# Patient Record
Sex: Female | Born: 1960 | Race: White | Hispanic: No
Health system: Southern US, Community
[De-identification: ages and names within clinical notes are randomized; demographics above are authoritative.]

---

## 2003-04-18 ENCOUNTER — Encounter: Admission: RE | Admit: 2003-04-18 | Discharge: 2003-04-18 | Payer: Self-pay | Admitting: Specialist

## 2017-11-04 ENCOUNTER — Other Ambulatory Visit: Payer: Self-pay | Admitting: Orthopedic Surgery

## 2017-11-05 ENCOUNTER — Other Ambulatory Visit: Payer: Self-pay | Admitting: Orthopedic Surgery

## 2017-11-05 DIAGNOSIS — M9142 Coxa magna, left hip: Secondary | ICD-10-CM

## 2017-11-05 DIAGNOSIS — M1612 Unilateral primary osteoarthritis, left hip: Secondary | ICD-10-CM

## 2017-11-13 ENCOUNTER — Other Ambulatory Visit: Payer: Self-pay

## 2017-11-13 ENCOUNTER — Ambulatory Visit
Admission: RE | Admit: 2017-11-13 | Discharge: 2017-11-13 | Disposition: A | Discharge status: Home / Self Care | Payer: 59 | Source: Ambulatory Visit | Attending: Orthopedic Surgery | Admitting: Orthopedic Surgery

## 2017-11-13 DIAGNOSIS — M9142 Coxa magna, left hip: Secondary | ICD-10-CM

## 2017-11-13 DIAGNOSIS — M1612 Unilateral primary osteoarthritis, left hip: Secondary | ICD-10-CM

## 2018-11-09 ENCOUNTER — Encounter: Payer: Self-pay | Admitting: Neurology

## 2018-11-09 ENCOUNTER — Other Ambulatory Visit: Payer: Self-pay

## 2018-11-09 ENCOUNTER — Ambulatory Visit (INDEPENDENT_AMBULATORY_CARE_PROVIDER_SITE_OTHER): Payer: Managed Care, Other (non HMO) | Admitting: Neurology

## 2018-11-09 ENCOUNTER — Encounter (INDEPENDENT_AMBULATORY_CARE_PROVIDER_SITE_OTHER): Payer: Managed Care, Other (non HMO) | Admitting: Neurology

## 2018-11-09 DIAGNOSIS — R200 Anesthesia of skin: Secondary | ICD-10-CM | POA: Diagnosis not present

## 2018-11-09 DIAGNOSIS — G5603 Carpal tunnel syndrome, bilateral upper limbs: Secondary | ICD-10-CM | POA: Diagnosis not present

## 2018-11-09 DIAGNOSIS — Z0289 Encounter for other administrative examinations: Secondary | ICD-10-CM

## 2018-11-09 NOTE — Progress Notes (Signed)
Full Name: Joann Wagner Gender: Female MRN #: [id number] Date of Birth: [birth year]    Visit Date: 11/09/2018 13:32 Age: [age] Years 0 Months Old Examining Physician: Lesia SagoKeith Willis, MD  Referring Physician: Jodi GeraldsJohn Graves, MD    History: Joann Bravoerri Clingerman is a 58 year old woman with numbness, pain and weakness in both hands, right worse than left.  Nerve conduction studies: Bilateral median motor responses were slowed across the wrist, right worse than left with normal forearm conduction velocities.  Amplitudes were normal though lower on the right than the left.  Bilateral median palmar mixed sensory responses were slowed across the wrist, right worse than left.. The right amplitude was markedly reduced.  The right orthodromic median sensory response was absent. . Bilateral ulnar motor responses were normal.  Bilateral ulnar sensory responses were normal.  Ulnar F-wave responses were normal.  Electromyography: Needle EMG of selected muscles of the right arm showed normal motor unit action potentials and recruitment.  There was no abnormal spontaneous activity.  Needle EMG of both abductor pollicis brevis muscles was normal.  Impression: This NCV/EMG study shows the following: 1.   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 2.   There is no evidence of a superimposed right radiculopathy.  Monda Chastain A. Epimenio FootSater, MD, PhD, FAAN Certified in Neurology, Clinical Neurophysiology, Sleep Medicine, Pain Medicine and Neuroimaging Director, [facility]  [facility] [address], [address] [place], [place] [address] [phone]       [facility]    Nerve / Sites Muscle Latency Ref. Amplitude Ref. Rel Amp Segments Distance Velocity Ref. Area    ms ms mV mV %  cm m/s m/s mVms  R Median - APB     Wrist APB 7.0 ?4.4 4.4 ?4.0 100 Wrist - APB 7   16.6     Upper arm APB 11.1  3.8  86.7 Upper arm - Wrist 20 49  ?49 17.6  L Median - APB     Wrist APB 5.3 ?4.4 5.3 ?4.0 100 Wrist - APB 7   16.6     Upper arm APB 9.3  3.7  69.9 Upper arm - Wrist 20 49 ?49 13.3  R Ulnar - ADM     Wrist ADM 2.1 ?3.3 12.9 ?6.0 100 Wrist - ADM 7   33.0     B.Elbow ADM 5.6  11.9  92.3 B.Elbow - Wrist 18 52 ?49 31.6     A.Elbow ADM 7.6  11.0  92.9 A.Elbow - B.Elbow 10 51 ?49 30.7         A.Elbow - Wrist      L Ulnar - ADM     Wrist ADM 2.4 ?3.3 11.2 ?6.0 100 Wrist - ADM 7   29.2     B.Elbow ADM 5.7  10.1  90.3 B.Elbow - Wrist 18 55 ?49 27.7     A.Elbow ADM 7.8  9.7  95.7 A.Elbow - B.Elbow 10 49 ?49 26.1         A.Elbow - Wrist                 SNC    Nerve / Sites Rec. Site Peak Lat Ref.  Amp Ref. Segments Distance Peak Diff Ref.    ms ms V V  cm ms ms  R Median, Ulnar - Transcarpal comparison     Median Palm Wrist 4.9 ?2.2 6 ?35 Median Palm - Wrist 8  Ulnar Palm Wrist 1.8 ?2.2 17 ?12 Ulnar Palm - Wrist 8          Median Palm - Ulnar Palm  3.1 ?0.4  L Median, Ulnar - Transcarpal comparison     Median Palm Wrist 3.3 ?2.2 37 ?35 Median Palm - Wrist 8       Ulnar Palm Wrist 1.7 ?2.2 14 ?12 Ulnar Palm - Wrist 8          Median Palm - Ulnar Palm  1.6 ?0.4  R Median - Orthodromic (Dig II, Mid palm)     Dig II Wrist NR ?3.4 NR ?10 Dig II - Wrist 13    L Median - Orthodromic (Dig II, Mid palm)     Dig II Wrist 4.4 ?3.4 3 ?10 Dig II - Wrist 13    R Ulnar - Orthodromic, (Dig V, Mid palm)     Dig V Wrist 2.5 ?3.1 7 ?5 Dig V - Wrist 11    L Ulnar - Orthodromic, (Dig V, Mid palm)     Dig V Wrist 2.4 ?3.1 7 ?5 Dig V - Wrist 22                   F  Wave    Nerve F Lat Ref.   ms ms  R Ulnar - ADM 25.7 ?32.0  L Ulnar - ADM 28.3 ?32.0         EMG full       EMG Summary Table    Spontaneous MUAP Recruitment  Muscle IA Fib PSW Fasc Other Amp Dur. Poly Pattern  R. Deltoid Normal None None None _______ Normal Normal Normal Normal  R. Triceps brachii Normal None None None _______ Normal Normal 1+ Normal  R. Biceps  brachii Normal None None None _______ Normal Normal Normal Normal  R. Extensor digitorum communis Normal None None None _______ Normal Normal Normal Normal  R. First dorsal interosseous Normal None None None _______ Normal Normal Normal Normal  R. Abductor pollicis brevis Normal None None None _______ Normal Normal Normal Normal  L. Abductor pollicis brevis Normal None None None _______ Normal Normal Normal Normal

## 2021-11-15 ENCOUNTER — Other Ambulatory Visit: Payer: Self-pay | Admitting: Surgery

## 2021-11-15 DIAGNOSIS — R928 Other abnormal and inconclusive findings on diagnostic imaging of breast: Secondary | ICD-10-CM

## 2021-11-25 ENCOUNTER — Ambulatory Visit
Admission: RE | Admit: 2021-11-25 | Discharge: 2021-11-25 | Disposition: A | Discharge status: Home / Self Care | Payer: No Typology Code available for payment source | Source: Ambulatory Visit | Attending: Surgery | Admitting: Surgery

## 2021-11-25 ENCOUNTER — Encounter: Payer: Self-pay | Admitting: Radiology

## 2021-11-25 DIAGNOSIS — R928 Other abnormal and inconclusive findings on diagnostic imaging of breast: Secondary | ICD-10-CM

## 2021-11-25 HISTORY — PX: BREAST BIOPSY: SHX20

## 2021-11-25 MED ORDER — GADOBUTROL 1 MMOL/ML IV SOLN: 10.0000 mL | Freq: Once | INTRAVENOUS | Status: DC | PRN

## 2023-07-25 IMAGING — MR MR BREAST BX W LOC DEV 1ST LESION IMAGE BX SPEC MR GUIDE*L*
8 of 12 series · 30 of 48 positions shown · IV contrast (10 ml gadavist)
Comparison: None Available.
COMPARISON: None Available.

Addendum:
CLINICAL DATA: Abnormal enhancement in the retroareolar region of
the left breast and in the upper-outer quadrant of the left breast.
MR guided core biopsies recommended.

EXAM:
MRI GUIDED CORE NEEDLE BIOPSY OF THE LEFT BREAST
TECHNIQUE: Multiplanar, multisequence MR imaging of the left breast was
performed both before and after administration of intravenous
contrast.
CONTRAST:  10 mL of Gadavist

[Series 2: fiducial unilateral · sagittal · 2.0mm · 1.33mm/px · 3 of 52 slices shown]
[im 1/52]
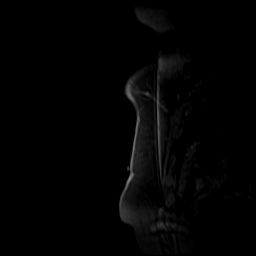
[im 26/52]
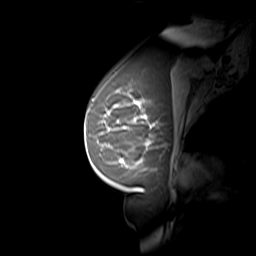
[im 52/52]
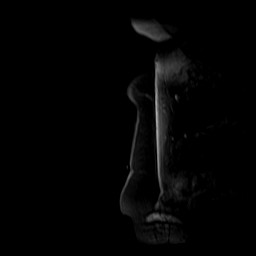

[Series 3: dynamic pre · axial · non-contrast · 1.3mm · 0.73mm/px · z∈[-80,+106]mm · 5 of 144 slices shown]
[im 1/144]
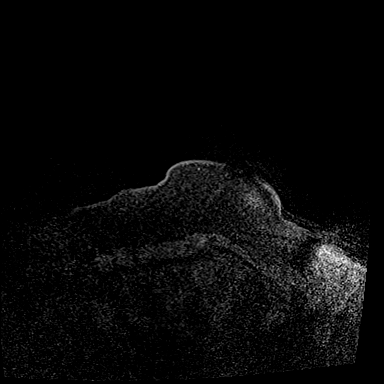
[im 36/144]
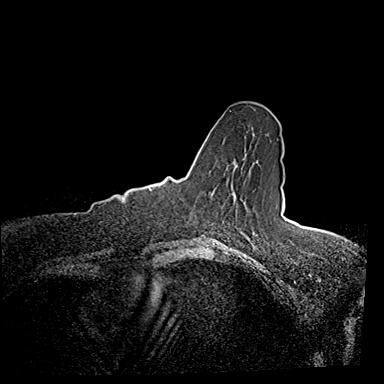
[im 72/144]
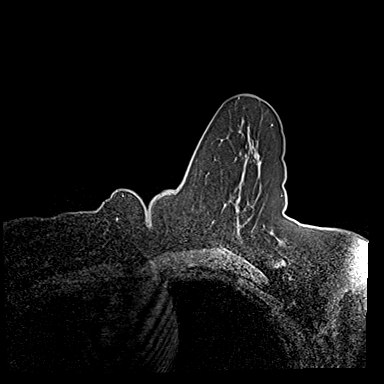
[im 108/144]
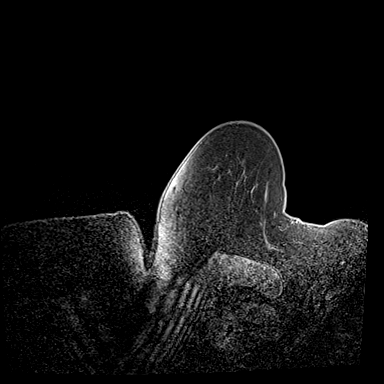
[im 144/144]
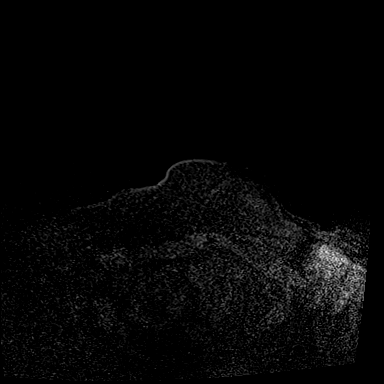

[Series 4: dynamic post 20 · axial · 1.3mm · 0.73mm/px · z∈[-80,+106]mm · 4 of 144 slices shown (1 of 2)]
[im 1/144]
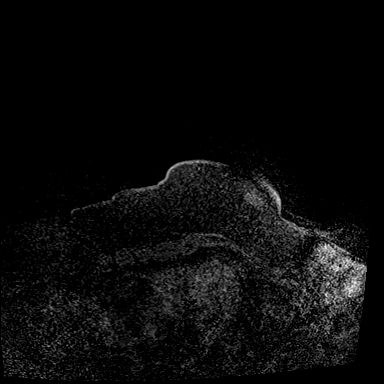
[im 48/144]
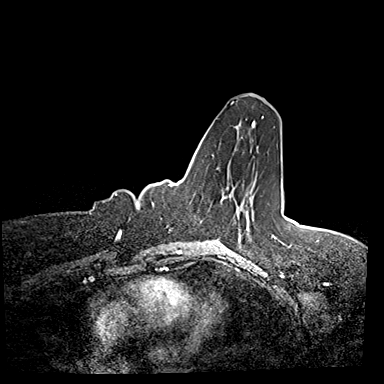
[im 96/144]
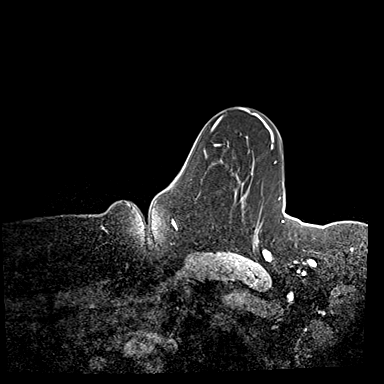
[im 144/144]
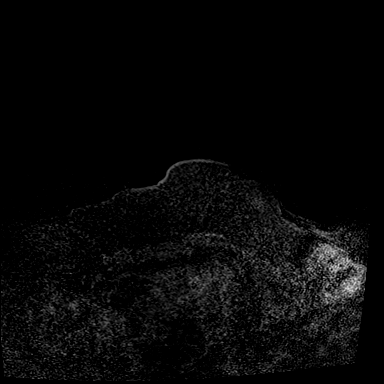

[Series 5: dynamic post 20 · axial · 1.3mm · 0.73mm/px · z∈[-80,+106]mm · 4 of 144 slices shown (2 of 2)]
[im 1/144]
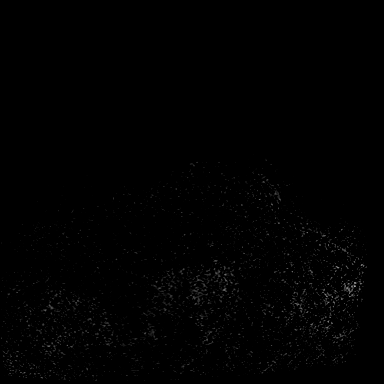
[im 48/144]
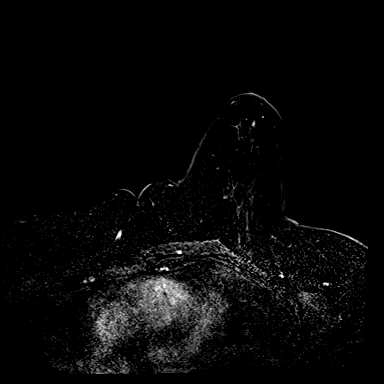
[im 96/144]
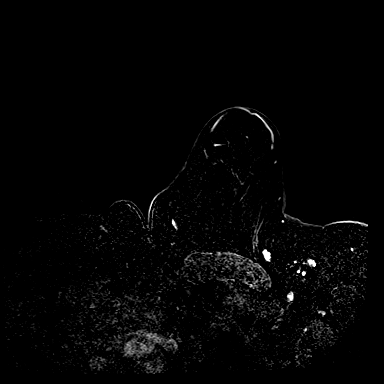
[im 144/144]
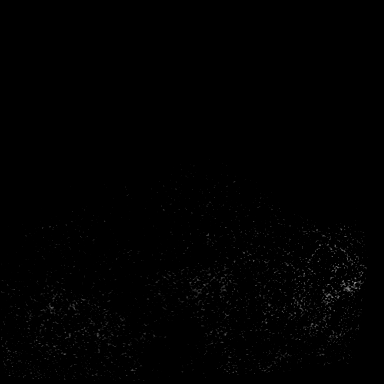

[Series 6: dynamic post 3 · axial · 1.3mm · 0.73mm/px · z∈[-80,+106]mm · 4 of 144 slices shown (1 of 2)]
[im 1/144]
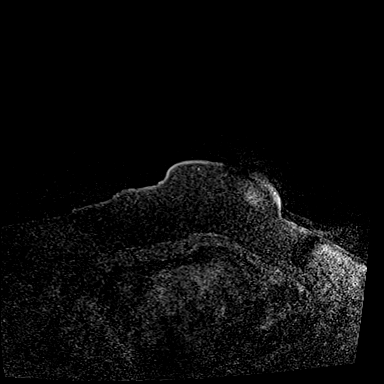
[im 48/144]
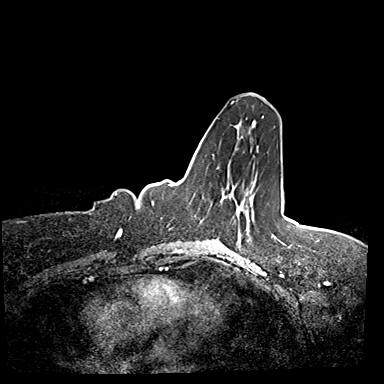
[im 96/144]
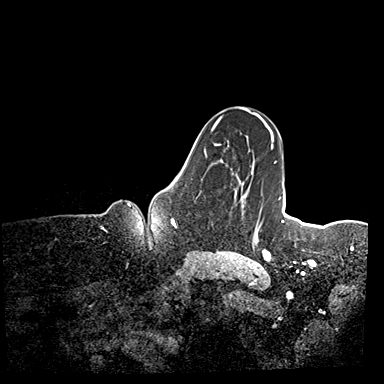
[im 144/144]
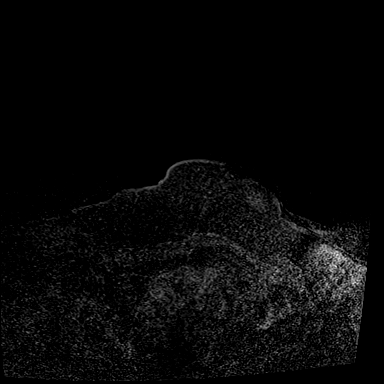

[Series 7: dynamic post 3 · axial · 1.3mm · 0.73mm/px · z∈[-80,+106]mm · 4 of 144 slices shown (2 of 2)]
[im 1/144]
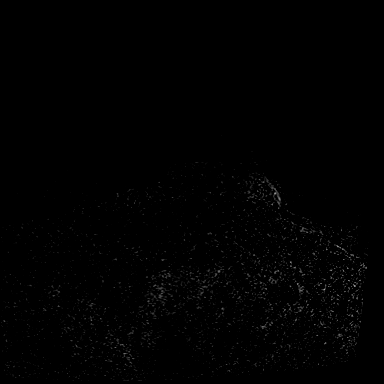
[im 48/144]
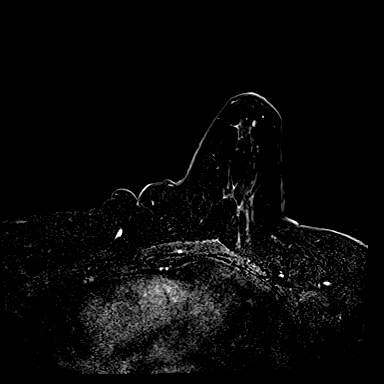
[im 96/144]
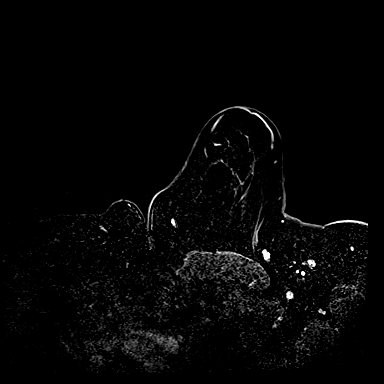
[im 144/144]
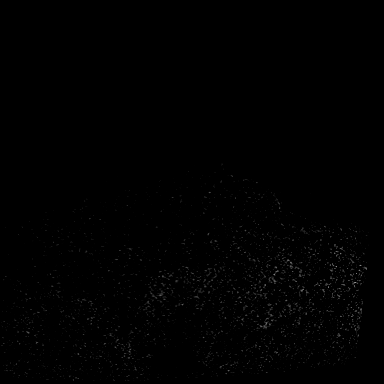

[Series 8: needle confirmation · axial · 1.3mm · 0.73mm/px · z∈[-80,+106]mm · 4 of 144 slices shown]
[im 1/144]
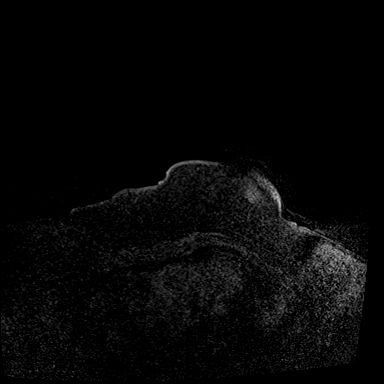
[im 48/144]
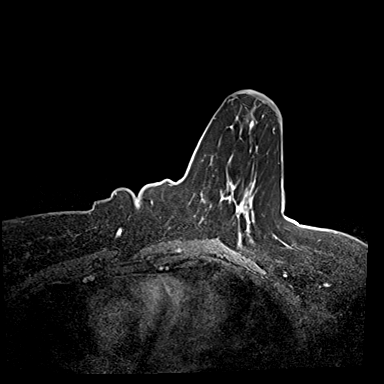
[im 96/144]
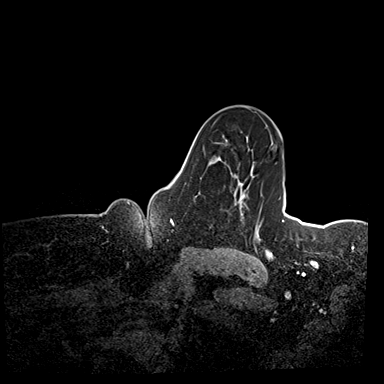
[im 144/144]
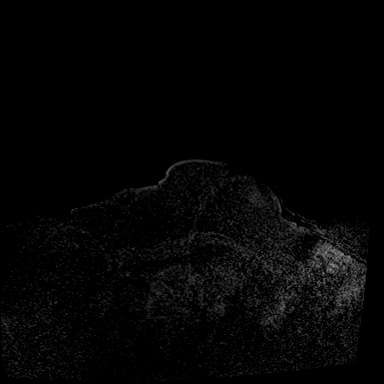

[Series 9: needle confirmation_sub · axial · 1.3mm · 0.73mm/px · z∈[-80,-19]mm · 2 of 144 slices shown]
[im 1/144]
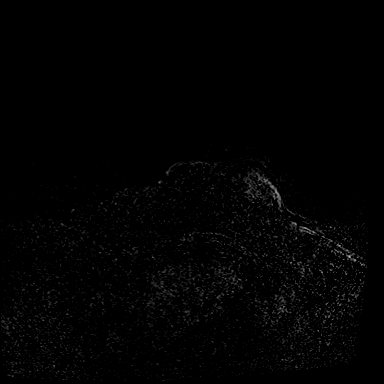
[im 48/144]
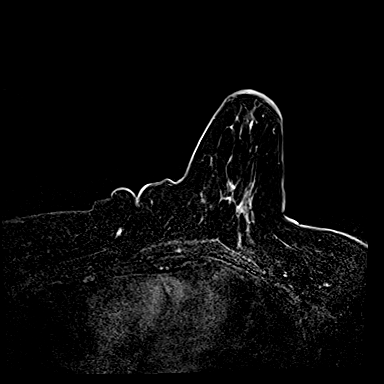

[30 of 48 positions shown; findings below may reference images not displayed]

FINDINGS: I met with the patient, and we discussed the procedure of MRI guided
biopsy, including risks, benefits, and alternatives. Specifically,
we discussed the risks of infection, bleeding, tissue injury, clip
migration, and inadequate sampling. Informed, written consent was
given. The usual time out protocol was performed immediately prior
to the procedure.

Using sterile technique, 1% Lidocaine, MRI guidance, and a 9 gauge
vacuum assisted device, biopsy was performed of abnormal enhancement
in the retroareolar region of the left breast using a lateral to
medial approach. At the conclusion of the procedure, a barbell
tissue marker clip was deployed into the biopsy cavity. Follow-up
2-view mammogram was performed and dictated separately.

I met with the patient, and we discussed the procedure of MRI guided
biopsy, including risks, benefits, and alternatives. Specifically,
we discussed the risks of infection, bleeding, tissue injury, clip
migration, and inadequate sampling. Informed, written consent was
given. The usual time out protocol was performed immediately prior
to the procedure.

Using sterile technique, 1% Lidocaine, MRI guidance, and a 9 gauge
vacuum assisted device, biopsy was performed of abnormal enhancement
in the upper-outer quadrant of the left breast using a lateral to
medial approach. At the conclusion of the procedure, a cylinder
tissue marker clip was deployed into the biopsy cavity. Follow-up
2-view mammogram was performed and dictated separately.
IMPRESSION: MRI guided biopsies of the left breast.  No apparent complications.

ADDENDUM:
Pathology revealed FRAGMENTS OF BENIGN INTRADUCTAL PAPILLOMA
PROLIFERATIVE FIBROCYSTIC CHANGES INCLUDING SCLEROSING ADENOSIS AND
USUAL DUCT HYPERPLASIA MICROCALCIFICATIONS PRESENT WITHIN ADENOSIS
NEGATIVE FOR CARCINOMA of the LEFT breast, retroareolar (barbell
clip). This was found to be concordant by Dr. Ademar Moten, with
surgical consultation for consideration of excision recommended.

Pathology revealed BENIGN BREAST WITH PROLIFERATIVE FIBROCYSTIC
CHANGES INCLUDING ADENOSIS AND USUAL DUCT HYPERPLASIA, COLUMNAR CELL
CHANGE, SMALL INTRADUCTAL PAPILLOMA, MICROCALCIFICATIONS PRESENT
WITHIN ADENOSIS AND COLUMNAR CELL CHANGE of the LEFT breast, upper
outer quadrant (cylinder clip. This was found to be concordant by
Dr. Ademar Moten, with surgical consultation for consideration of
excision recommended.

Pathology results were discussed with the patient by telephone. The
patient reported doing well after the biopsies with tenderness at
the sites. Post biopsy instructions and care were reviewed and
questions were answered. The patient was encouraged to call The

Per patient, she is scheduled with Dr. Md Mizanur Alipio on December 02, 2021.

Pathology results reported by Nizamy Fansuri RN on 11/26/2021.

*** End of Addendum ***
FINDINGS: I met with the patient, and we discussed the procedure of MRI guided
biopsy, including risks, benefits, and alternatives. Specifically,
we discussed the risks of infection, bleeding, tissue injury, clip
migration, and inadequate sampling. Informed, written consent was
given. The usual time out protocol was performed immediately prior
to the procedure.

Using sterile technique, 1% Lidocaine, MRI guidance, and a 9 gauge
vacuum assisted device, biopsy was performed of abnormal enhancement
in the retroareolar region of the left breast using a lateral to
medial approach. At the conclusion of the procedure, a barbell
tissue marker clip was deployed into the biopsy cavity. Follow-up
2-view mammogram was performed and dictated separately.

I met with the patient, and we discussed the procedure of MRI guided
biopsy, including risks, benefits, and alternatives. Specifically,
we discussed the risks of infection, bleeding, tissue injury, clip
migration, and inadequate sampling. Informed, written consent was
given. The usual time out protocol was performed immediately prior
to the procedure.

Using sterile technique, 1% Lidocaine, MRI guidance, and a 9 gauge
vacuum assisted device, biopsy was performed of abnormal enhancement
in the upper-outer quadrant of the left breast using a lateral to
medial approach. At the conclusion of the procedure, a cylinder
tissue marker clip was deployed into the biopsy cavity. Follow-up
2-view mammogram was performed and dictated separately.
IMPRESSION: MRI guided biopsies of the left breast.  No apparent complications.

## 2023-07-25 IMAGING — MG MM BREAST LOCALIZATION CLIP
4 series · 4 of 12 positions shown · non-contrast
Comparison: Previous exam(s).

CLINICAL DATA: Status post MR guided core biopsies the left breast

EXAM:
3D DIAGNOSTIC LEFT MAMMOGRAM POST ULTRASOUND BIOPSY

[L ML synth-2D]
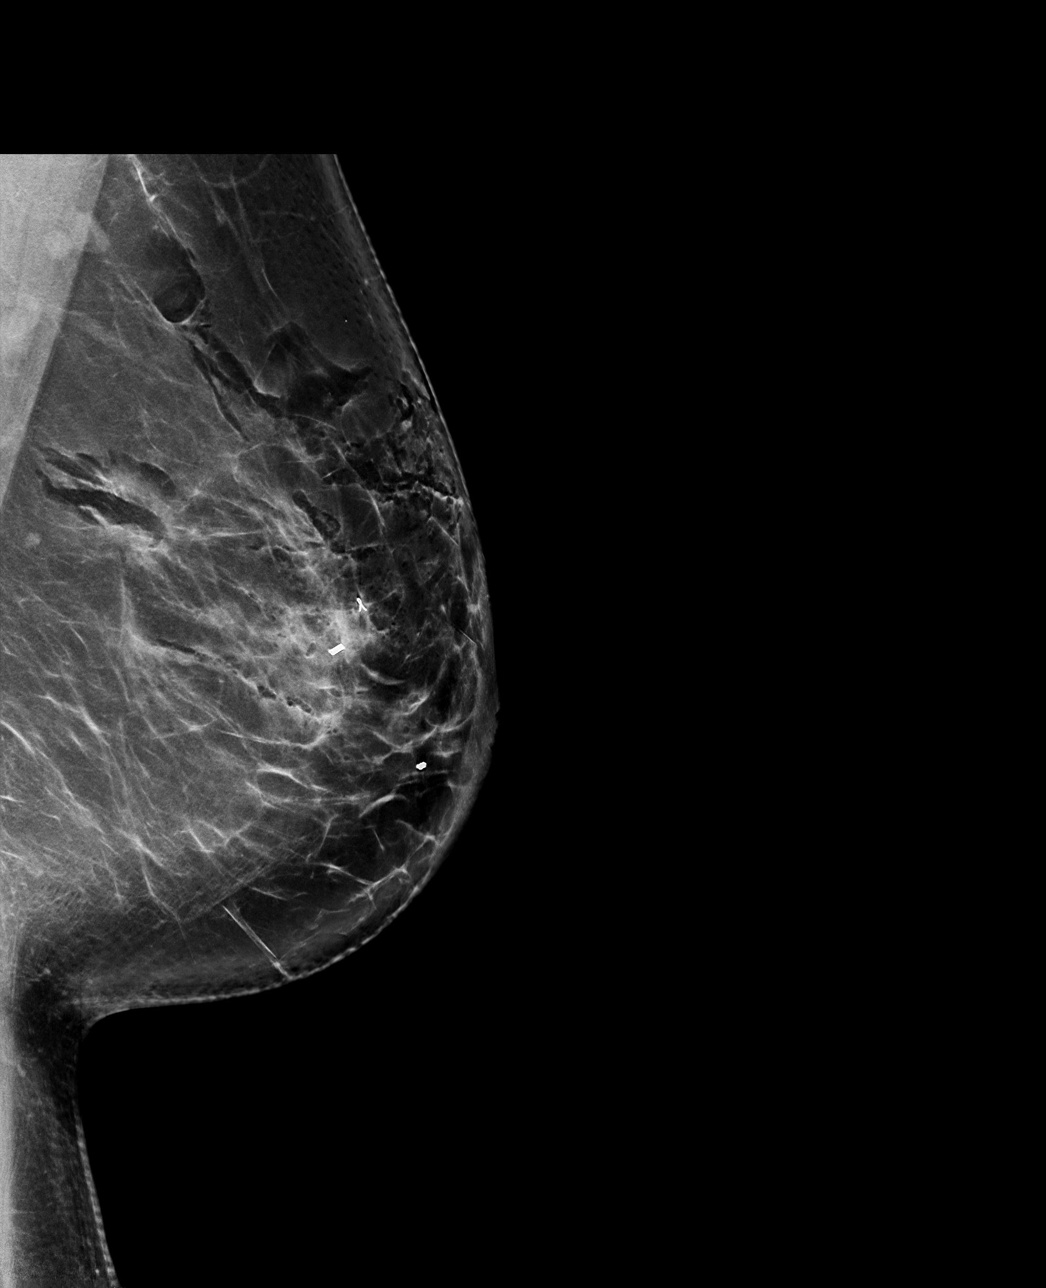

[L CC synth-2D]
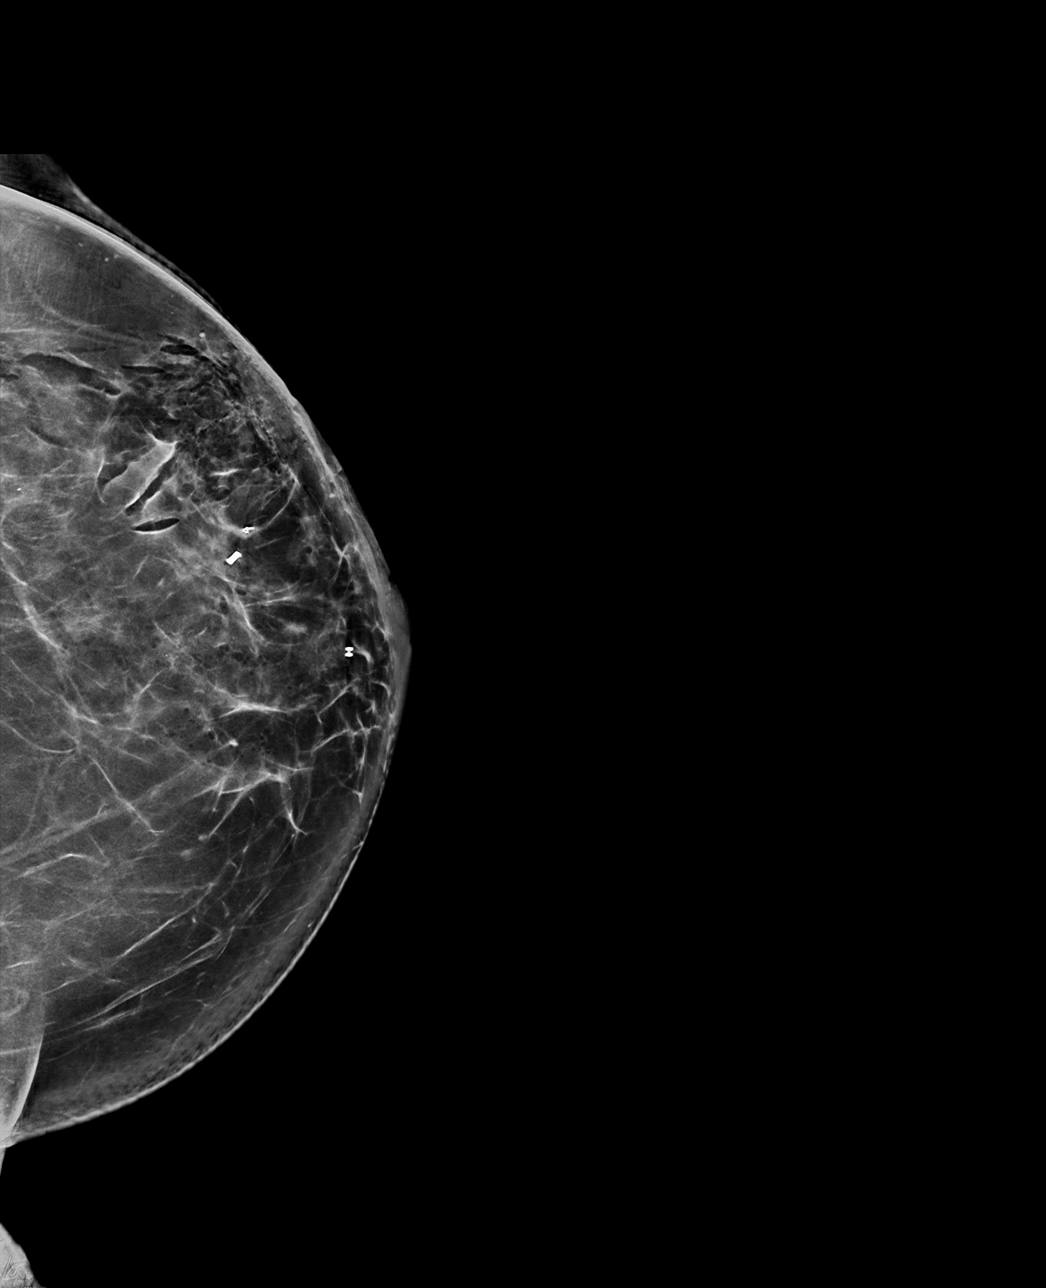

[L ML tomo · tomo slice 53/104.0]
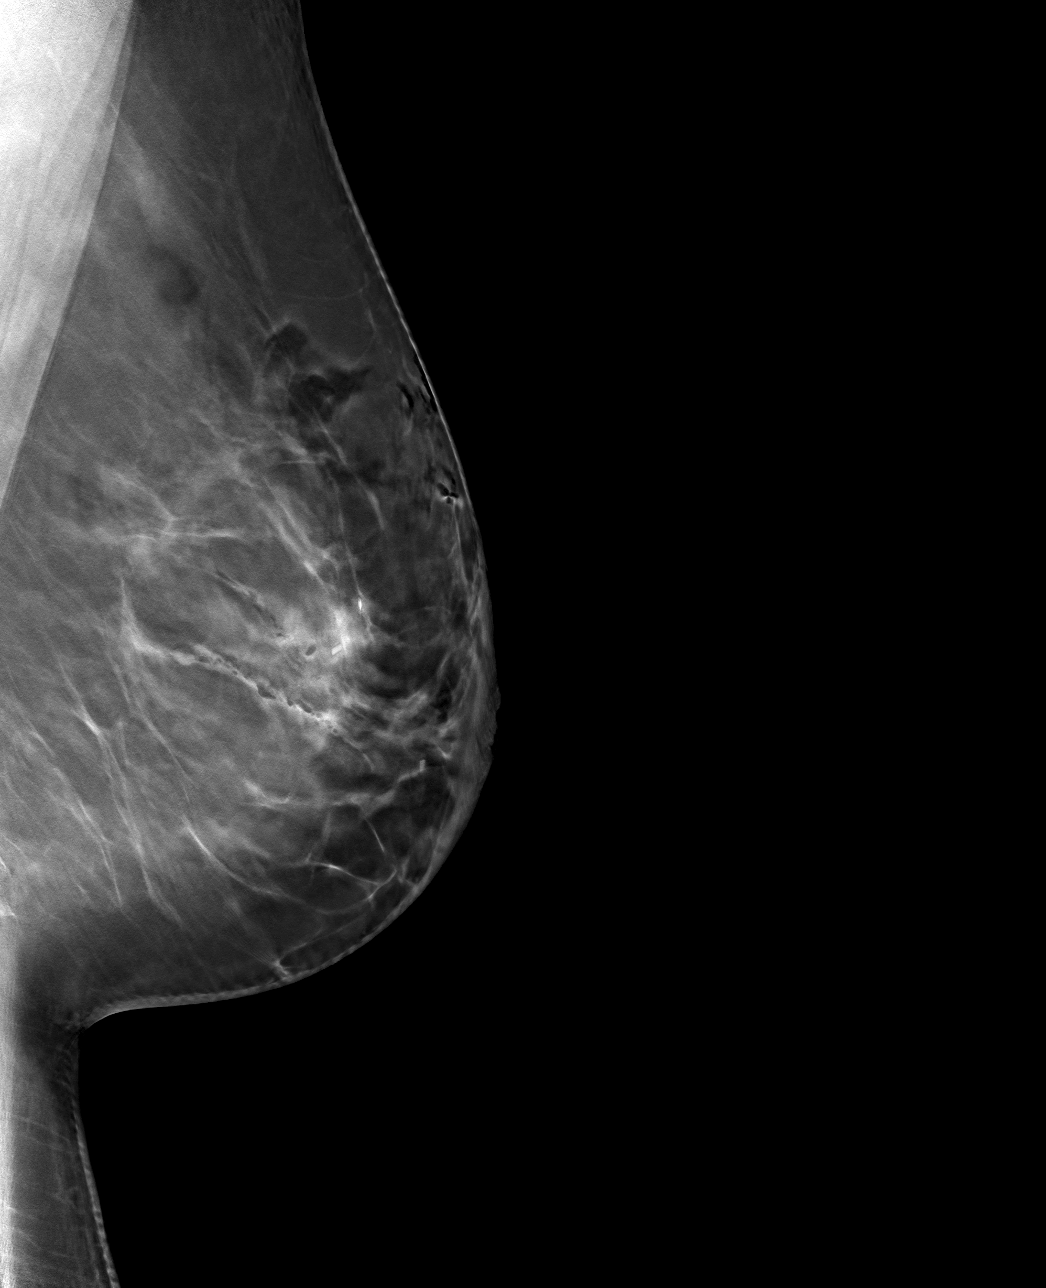

[L CC tomo · tomo slice 48/95.0]
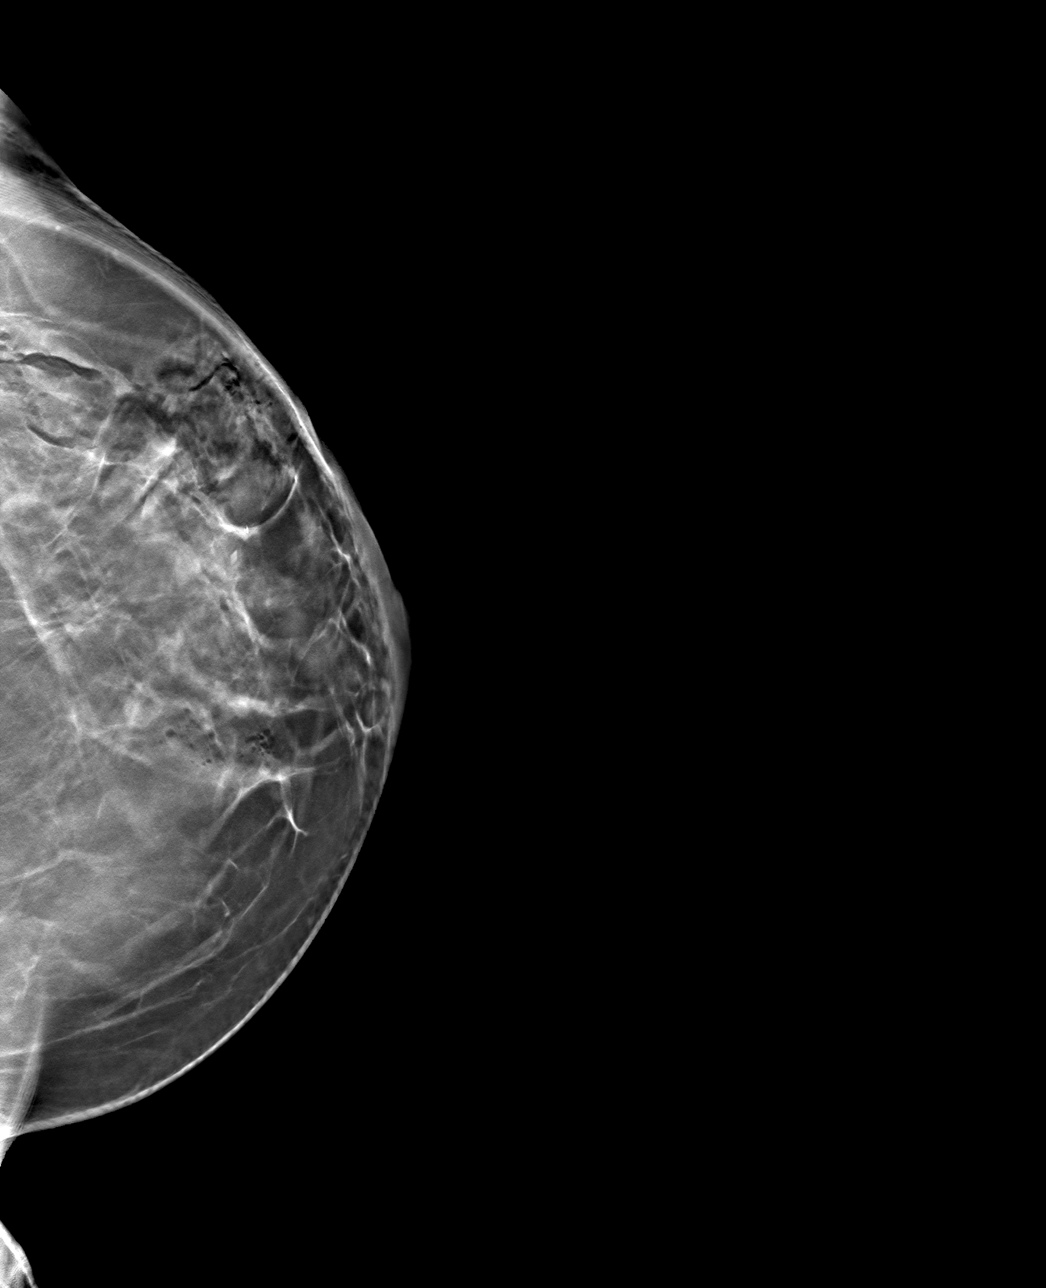

[4 of 12 positions shown; findings below may reference images not displayed]

FINDINGS: 3D Mammographic images were obtained following MR guided core
biopsies of the left breast. The barbell shaped clip is in good
position in the retroareolar region of the left breast in the
cylindrical shaped clip is in good position in the upper-outer
quadrant of the left breast.
IMPRESSION: Appropriate positioning of the barbell shaped clip in the
retroareolar region of the left breast and cylindrical shaped clip
in the upper-outer quadrant of the left breast.

Final Assessment: Post Procedure Mammograms for Marker Placement
